# Patient Record
Sex: Male | Born: 2000 | Race: White | Hispanic: No | Marital: Single | Smoking: Never smoker
Health system: Southern US, Community
[De-identification: ages and names within clinical notes are randomized; demographics above are authoritative.]

## PROBLEM LIST (undated history)

## (undated) DIAGNOSIS — F8081 Childhood onset fluency disorder: Secondary | ICD-10-CM

## (undated) DIAGNOSIS — G459 Transient cerebral ischemic attack, unspecified: Secondary | ICD-10-CM

## (undated) HISTORY — PX: NO PAST SURGERIES: SHX2092

---
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
## 2020-08-18 ENCOUNTER — Telehealth (INDEPENDENT_AMBULATORY_CARE_PROVIDER_SITE_OTHER): Payer: Self-pay | Admitting: Pediatrics

## 2020-08-18 NOTE — Telephone Encounter (Signed)
°  Who's calling (name and relationship to patient) :mom / Ria Bush   Best contact number:[phone]  Provider they see:Dr. Artis Flock   Reason for call:mom called stating that [facility] needs a referral sent to them so they can see the patient. He has aged out and last was in office in 2020 by Dr. Artis Flock . Vikkie with [facility] her number is [phone]      PRESCRIPTION REFILL ONLY  Name of prescription:  Pharmacy:

## 2020-08-18 NOTE — Telephone Encounter (Signed)
A new referral needs to be put in for this patient, please advise

## 2020-08-18 NOTE — Telephone Encounter (Signed)
The patient last saw me in 2019 and never followed up.  I can not give a recommendation on what he needs currently. He can return for follow-up here where we decide what to do (we do not take new referrals after 18 but we don't have an "age out" limit) or best plan is the PCP can send a referral to an adult neurologist if he still has a concern.  Lorenz Coaster MD MPH

## 2020-08-19 NOTE — Telephone Encounter (Signed)
I called and spoke to patient's mother. DPR on file to speak with her. I advised patient's PCP is who needs to send a referral to [facility]. She advised patient does not currently have a PCP. I advised that he could follow up with Dr. Artis Flock. She agreed and scheduled an appointment for 08/21/20. Barrington Ellison

## 2020-08-21 ENCOUNTER — Ambulatory Visit (INDEPENDENT_AMBULATORY_CARE_PROVIDER_SITE_OTHER): Payer: Medicaid Other | Admitting: Pediatrics

## 2020-08-21 ENCOUNTER — Encounter (INDEPENDENT_AMBULATORY_CARE_PROVIDER_SITE_OTHER): Payer: Self-pay | Admitting: Pediatrics

## 2020-08-21 ENCOUNTER — Other Ambulatory Visit: Payer: Self-pay

## 2020-08-21 VITALS — BP 130/70 | HR 64 | Ht 71.0 in | Wt 210.0 lb

## 2020-08-21 DIAGNOSIS — R202 Paresthesia of skin: Secondary | ICD-10-CM | POA: Diagnosis not present

## 2020-08-21 MED ORDER — DULOXETINE HCL 20 MG PO CPEP: 20.0000 mg | ORAL_CAPSULE | Freq: Every day | ORAL | 3 refills | Status: DC

## 2020-08-21 NOTE — Patient Instructions (Signed)
Work-up thus far shows no answers for all over parasthesia, however most common cause is neuropathy (below) Recommend trying cymbalta to see if this helps Referral to adult neurology for further evaluation and management Recommend www.neurosymptoms.org for more information on potential non-neurologic causes of neurologic symptoms.    Peripheral Neuropathy Peripheral neuropathy is a type of nerve damage. It affects nerves that carry signals between the spinal cord and the arms, legs, and the rest of the body (peripheral nerves). It does not affect nerves in the spinal cord or brain. In peripheral neuropathy, one nerve or a group of nerves may be damaged. Peripheral neuropathy is a broad category that includes many specific nerve disorders, like diabetic neuropathy, hereditary neuropathy, and carpal tunnel syndrome. What are the causes? This condition may be caused by:  Diabetes. This is the most common cause of peripheral neuropathy.  Nerve injury.  Pressure or stress on a nerve that lasts a long time.  Lack (deficiency) of B vitamins. This can result from alcoholism, poor diet, or a restricted diet.  Infections.  Autoimmune diseases, such as rheumatoid arthritis and systemic lupus erythematosus.  Nerve diseases that are passed from parent to child (inherited).  Some medicines, such as cancer medicines (chemotherapy).  Poisonous (toxic) substances, such as lead and mercury.  Too little blood flowing to the legs.  Kidney disease.  Thyroid disease. In some cases, the cause of this condition is not known. What are the signs or symptoms? Symptoms of this condition depend on which of your nerves is damaged. Common symptoms include:  Loss of feeling (numbness) in the feet, hands, or both.  Tingling in the feet, hands, or both.  Burning pain.  Very sensitive skin.  Weakness.  Not being able to move a part of the body (paralysis).  Muscle twitching.  Clumsiness or poor  coordination.  Loss of balance.  Not being able to control your bladder.  Feeling dizzy.  Sexual problems. How is this diagnosed? Diagnosing and finding the cause of peripheral neuropathy can be difficult. Your health care provider will take your medical history and do a physical exam. A neurological exam will also be done. This involves checking things that are affected by your brain, spinal cord, and nerves (nervous system). For example, your health care provider will check your reflexes, how you move, and what you can feel. You may have other tests, such as:  Blood tests.  Electromyogram (EMG) and nerve conduction tests. These tests check nerve function and how well the nerves are controlling the muscles.  Imaging tests, such as CT scans or MRI to rule out other causes of your symptoms.  Removing a small piece of nerve to be examined in a lab (nerve biopsy). This is rare.  Removing and examining a small amount of the fluid that surrounds the brain and spinal cord (lumbar puncture). This is rare. How is this treated? Treatment for this condition may involve:  Treating the underlying cause of the neuropathy, such as diabetes, kidney disease, or vitamin deficiencies.  Stopping medicines that can cause neuropathy, such as chemotherapy.  Medicine to relieve pain. Medicines may include: ? Prescription or over-the-counter pain medicine. ? Antiseizure medicine. ? Antidepressants. ? Pain-relieving patches that are applied to painful areas of skin.  Surgery to relieve pressure on a nerve or to destroy a nerve that is causing pain.  Physical therapy to help improve movement and balance.  Devices to help you move around (assistive devices). Follow these instructions at home: Medicines  Take  over-the-counter and prescription medicines only as told by your health care provider. Do not take any other medicines without first asking your health care provider.  Do not drive or use heavy  machinery while taking prescription pain medicine. Lifestyle   Do not use any products that contain nicotine or tobacco, such as cigarettes and e-cigarettes. Smoking keeps blood from reaching damaged nerves. If you need help quitting, ask your health care provider.  Avoid or limit alcohol. Too much alcohol can cause a vitamin B deficiency, and vitamin B is needed for healthy nerves.  Eat a healthy diet. This includes: ? Eating foods that are high in fiber, such as fresh fruits and vegetables, whole grains, and beans. ? Limiting foods that are high in fat and processed sugars, such as fried or sweet foods. General instructions   If you have diabetes, work closely with your health care provider to keep your blood sugar under control.  If you have numbness in your feet: ? Check every day for signs of injury or infection. Watch for redness, warmth, and swelling. ? Wear padded socks and comfortable shoes. These help protect your feet.  Develop a good support system. Living with peripheral neuropathy can be stressful. Consider talking with a mental health specialist or joining a support group.  Use assistive devices and attend physical therapy as told by your health care provider. This may include using a walker or a cane.  Keep all follow-up visits as told by your health care provider. This is important. Contact a health care provider if:  You have new signs or symptoms of peripheral neuropathy.  You are struggling emotionally from dealing with peripheral neuropathy.  Your pain is not well-controlled. Get help right away if:  You have an injury or infection that is not healing normally.  You develop new weakness in an arm or leg.  You fall frequently. Summary  Peripheral neuropathy is when the nerves in the arms, or legs are damaged, resulting in numbness, weakness, or pain.  There are many causes of peripheral neuropathy, including diabetes, pinched nerves, vitamin  deficiencies, autoimmune disease, and hereditary conditions.  Diagnosing and finding the cause of peripheral neuropathy can be difficult. Your health care provider will take your medical history, do a physical exam, and do tests, including blood tests and nerve function tests.  Treatment involves treating the underlying cause of the neuropathy and taking medicines to help control pain. Physical therapy and assistive devices may also help. This information is not intended to replace advice given to you by your health care provider. Make sure you discuss any questions you have with your health care provider. Document Revised: 09/22/2017 Document Reviewed: 12/19/2016 Elsevier Patient Education  2020 [employer].

## 2020-08-21 NOTE — Progress Notes (Signed)
Patient: Jordan Turner MRN: [id number] Sex: male DOB: January 20, 2001  Provider: Lorenz Coaster, MD Location of Care: [facility]  Note type: Routine follow-up  History of Present Illness:  Jordan Turner is a 19 y.o. male with no significant past medical history who I am seeing for routine follow-up. Patient was last seen on 08/15/18 where patient was evaluated for parenthesia. Patient was referred to [facility] for NCS/EMG length dependent paraesthesia.  Since the last appointment, patient did not follow up.    Patient presents today with mother.     Paresthesia: Little to no improvement. Feet is still numb however there is no longer a static feeling. Numbness continues to be from the knee of each leg to feet. No longer having lost of taste, shortness of breath or tightness in his chest.  Pain: Constant pain throughout the body mostly in the extremities. Mostly after first waking up. Will have some issue holding some things; drops pencils and forks at times. Mother reports that he does not feel when being hit in the arm.   GI: no issues sensing his need to use the bathroom.  Sleep: Falls asleep at about 1am and wakes up at 10:30 am. Usually sleeps through the night. No naps thought the day. Denies being woken up by the pain  School: In college Lives at home.   Mood: Patient reports that they are happy.   Diagnostics: 10/25/18 Nerve Conduction Study with EMG: IMPRESSION: This is a normal study.  No electrodiagnostic evidence of large fiber neuropathy or myopathy at this time    Past Medical History Past Medical History:  Diagnosis Date   Stutter    TIA (transient ischemic attack)     Surgical History Past Surgical History:  Procedure Laterality Date   NO PAST SURGERIES      Family History family history includes Anxiety disorder in his sister; Bipolar disorder in his maternal grandfather; Depression in his sister; Migraines in his  mother.   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 He lives with mom, dad, and siblings. He enjoys playing video games, read books, and talk to people.     Allergies No Known Allergies  Medications No current outpatient medications on file prior to visit.   No current facility-administered medications on file prior to visit.   The medication list was reviewed and reconciled. All changes or newly prescribed medications were explained.  A complete medication list was provided to the patient/caregiver.  Physical Exam BP 130/70    Pulse 64    Ht 5\' 11"  (1.803 m)    Wt 210 lb (95.3 kg)    BMI 29.29 kg/m  95 %ile (Z= 1.66) based on CDC (Boys, 2-20 Years) weight-for-age data using vitals from 08/21/2020.  No exam data present Gen: well appearing young man Skin: No rash, No neurocutaneous stigmata. HEENT: Normocephalic, no dysmorphic features, no conjunctival injection, nares patent, mucous membranes moist, oropharynx clear. Neck: Supple, no meningismus. No focal tenderness. Resp: Clear to auscultation bilaterally CV: Regular rate, normal S1/S2, no murmurs, no rubs Abd: BS present, abdomen soft, non-tender, non-distended. No hepatosplenomegaly or mass Ext: Warm and well-perfused. No deformities, no muscle wasting, ROM full.  Neurological Examination: MS: Awake, alert, interactive. Normal eye contact, answered the questions appropriately for age, speech was fluent,  Normal comprehension.  Attention and concentration were normal. Cranial Nerves: Pupils were equal and reactive to light;  normal  fundoscopic exam with sharp discs, visual field full with confrontation test; EOM normal, no nystagmus; no ptsosis, no double vision, intact facial sensation, face symmetric with full strength of facial muscles, hearing intact to finger rub bilaterally, palate elevation is symmetric, tongue protrusion is symmetric with  full movement to both sides.  Sternocleidomastoid and trapezius are with normal strength. Motor-Normal tone throughout, No abnormal movements          Delt    Bic  Tri  Wr Ex  Grip   Hip flex  Hip ex Knee flex Knee ex  Dorsi  Plantar L 5      5     5     5         5       5          5         5               5     5    5   R 5      5     5     5         5       5          5         5               5     5    5   Reflexes- Reflexes 2+ and symmetric in the biceps, triceps, patellar and achilles tendon. Plantar responses flexor bilaterally, no clonus noted Sensation:Light touch, pinprick, position sense, and vibration sense are decreased thorughout but intact.  Coordination and balance: No dysmetria on FTN test. No difficulty with balance when standing on one foot bilaterally. Romberg negative.   Gait: Gait is steady with normal steps, base, arm swing, and turning. Tandem gait was normal. Was able to perform toe walking and heel walking without difficulty.    Diagnosis: 1. Paresthesia     Assessment and Plan [facility] is a 19 y.o. male with history of  all over parasthesia who I am seeing in follow-up. Patient continues to have symptoms of paresthesia. On exam strength, reflexes  and coordination are normal.  He reports decreased stimulus to fine touch and pin prick thorughout, however it does not correspond to dermatomes. MRI, labwork, EMG negative for any central or peripheral cause.  I discussed symptomatic medication management for pain and see if this also helps with the numbness patient is experiencing. I gave options such as gabapentin and Cymbalta. Mother and patient preferred to try Cymbalta as it will be only once a day. I will refer patient to adult Turner given age and greater experience with sensorineural symptoms for any further recommendations.   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     Return in about 3 months (around 11/21/2020).  12 MD MPH [facility]  [address] [place], [place], [address] [place] Phone: [phone]   By signing below, I, [place] attest that this documentation has been prepared under the direction of [id number], MD.    I, [phone], MD personally performed the services described in this documentation. All medical record entries made by the scribe were at my direction. I have reviewed the  chart and agree that the record reflects my personal performance and is accurate and complete Electronically signed by Denyce Robert and Jordan Coaster, MD 09/14/20 8:36 AM

## 2020-11-11 ENCOUNTER — Ambulatory Visit: Payer: Medicaid Other | Admitting: Diagnostic Neuroimaging

## 2020-11-27 ENCOUNTER — Ambulatory Visit (INDEPENDENT_AMBULATORY_CARE_PROVIDER_SITE_OTHER): Payer: Medicaid Other | Admitting: Pediatrics

## 2020-11-27 NOTE — Progress Notes (Incomplete)
   Patient: Jordan Turner MRN: [id number] Sex: male DOB: October 18, 2001  Provider: Lorenz Coaster, MD Location of Care: [facility]  Note type: Routine follow-up  History of Present Illness:  [facility] is a 20 y.o. male with history of parenthesia who I am seeing for routine follow-up. Patient was last seen on 08/21/20 where Cymbalta 20mg  was started for chronic neuropathic pain and referral was made to adult neurology for further evaluation and management.  Since the last appointment,  patient has had no ED visits or hospital admissions.  Patient presents today with ***.      Screenings:  Patient History:   Diagnostics:    Past Medical History Past Medical History:  Diagnosis Date  . Stutter   . TIA (transient ischemic attack)     Surgical History Past Surgical History:  Procedure Laterality Date  . NO PAST SURGERIES      Family History family history includes Anxiety disorder in his sister; Bipolar disorder in his maternal grandfather; Depression in his sister; Migraines in his mother.   Social History Social History   Social History Narrative   Jordan Turner is a [employer]. He attended [employer]. He lives with mom, dad, and siblings. He enjoys playing video games, read books, and talk to people.     Allergies No Known Allergies  Medications Current Outpatient Medications on File Prior to Visit  Medication Sig Dispense Refill  . DULoxetine (CYMBALTA) 20 MG capsule Take 1 capsule (20 mg total) by mouth daily. 31 capsule 3   No current facility-administered medications on file prior to visit.   The medication list was reviewed and reconciled. All changes or newly prescribed medications were explained.  A complete medication list was provided to the patient/caregiver.  Physical Exam There were no vitals taken for this visit. No weight on file for this encounter.  No exam data  present  ***   Diagnosis:@DIAGLIST @   Assessment and Plan Ibrahem Volkman is a 20 y.o. male with history of ***who I am seeing in follow-up.     No follow-ups on file.  12 MD MPH Neurology and Neurodevelopment [facility]  [address] [place], [place], [place] Phone: [phone]       By signing below, I, [phone] attest that this documentation has been prepared under the direction of Denyce Robert, MD.    I, Lorenz Coaster, MD personally performed the services described in this documentation. All medical record entries made by the scribe were at my direction. I have reviewed the chart and agree that the record reflects my personal performance and is accurate and complete Electronically signed by Lorenz Coaster and Denyce Robert, MD *** ***

## 2021-01-12 ENCOUNTER — Encounter: Payer: Self-pay | Admitting: Diagnostic Neuroimaging

## 2021-01-12 ENCOUNTER — Ambulatory Visit (INDEPENDENT_AMBULATORY_CARE_PROVIDER_SITE_OTHER): Payer: Medicaid Other | Admitting: Diagnostic Neuroimaging

## 2021-01-12 VITALS — BP 151/72 | HR 68 | Ht 70.0 in | Wt 211.4 lb

## 2021-01-12 DIAGNOSIS — R202 Paresthesia of skin: Secondary | ICD-10-CM | POA: Diagnosis not present

## 2021-01-12 NOTE — Patient Instructions (Signed)
  SENSORY DISTURBANCE - unclear neurologic etiology; does not fit with TIA; I agree with Dr. Artis Flock, who also suspected conversion reaction (MRI brain, MRI cervical spine, EMG/NCS, labs are normal) - reassured patient; encouraged to improve sleep hygiene, nutrition, exercise

## 2021-01-12 NOTE — Progress Notes (Signed)
[facility]  PATIENT: [facility] DOB: [birth year]  REFERRING CLINICIAN: Nyoka Cowden, MD HISTORY FROM: Patient REASON FOR VISIT: New consult   HISTORICAL  CHIEF COMPLAINT:  Chief Complaint  Patient presents with  . Paresthesias    Rm 6 New Pt, had EMG 10/25/2018 "unable to feel textures , constant pain in arms/legs, can't feel"     HISTORY OF PRESENT ILLNESS:   20 year old male here for evaluation of abnormal sensation ability.  Patient has history of premature birth and developmental delay.  May 2019 patient had onset of inability to feel cold or hot sensations, textures.  He was also evaluated for chest pain and shortness of breath.  No specific triggering factors.  Neurologic examination was variable and inconsistent.  EMG nerve conduction study, MRI of the brain and cervical spine are unremarkable.  Possibility of conversion reaction was raised.  Patient has had some improvement in symptoms since that time.  Now he can feel pain in temperature.  He is still having difficulty identifying certain textures.  Patient currently at [employer].  He also works at [employer] 3 days a week, night shift.  He has interrupted and limited sleep.  He is not exercising that much.   REVIEW OF SYSTEMS: Full 14 system review of systems performed and negative with exception of: As per HPI.  ALLERGIES: No Known Allergies  HOME MEDICATIONS: Outpatient Medications Prior to Visit  Medication Sig Dispense Refill  . DULoxetine (CYMBALTA) 20 MG capsule Take 1 capsule (20 mg total) by mouth daily. (Patient not taking: Reported on 01/12/2021) 31 capsule 3   No facility-administered medications prior to visit.    PAST MEDICAL HISTORY: Past Medical History:  Diagnosis Date  . Premature birth    at 24 weeks  . Stutter   . TIA (transient ischemic attack) 2019    PAST SURGICAL HISTORY: Past Surgical History:  Procedure Laterality  Date  . NO PAST SURGERIES      FAMILY HISTORY: Family History  Problem Relation Age of Onset  . Migraines Mother   . Hypertension Mother   . Cervical cancer Mother   . Diabetes Mother   . Depression Sister   . Anxiety disorder Sister   . Bipolar disorder Sister   . Diabetes Sister   . Other Brother        Von Willebrand disease  . Hypertension Maternal Grandmother   . Diabetes Maternal Grandmother   . Bipolar disorder Maternal Grandfather   . Seizures Neg Hx   . Schizophrenia Neg Hx   . ADD / ADHD Neg Hx   . Autism Neg Hx     SOCIAL HISTORY: Social History   Socioeconomic History  . Marital status: Single    Spouse name: Not on file  . Number of children: 0  . Years of education: 65  . Highest education level: Not on file  Occupational History    Comment: student, [employer]  Tobacco Use  . Smoking status: Never Smoker  . Smokeless tobacco: Never Used  Substance and Sexual Activity  . Alcohol use: Never  . Drug use: Never  . Sexual activity: Not on file  Other Topics Concern  . 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 He lives with mom, dad, and siblings. He enjoys playing video games, read books, and talk to people.    Social Determinants of Corporate investment banker  Strain: Not on file  Food Insecurity: Not on file  Transportation Needs: Not on file  Physical Activity: Not on file  Stress: Not on file  Social Connections: Not on file  Intimate Partner Violence: Not on file     PHYSICAL EXAM  GENERAL EXAM/CONSTITUTIONAL: Vitals:  Vitals:   01/12/21 1424  BP: (!) 151/72  Pulse: 68  Weight: 211 lb 6.4 oz (95.9 kg)  Height: 5\' 10"  (1.778 m)   Body mass index is 30.33 kg/m. Wt Readings from Last 3 Encounters:  01/12/21 211 lb 6.4 oz (95.9 kg) (95 %, Z= 1.66)*  08/21/20 210 lb (95.3 kg) (95 %, Z= 1.66)*  08/15/18 176 lb 3.2 oz (79.9 kg) (85 %, Z= 1.05)*   * Growth percentiles are  based on CDC (Boys, 2-20 Years) data.    Patient is in no distress; well developed, nourished and groomed; neck is supple  CARDIOVASCULAR:  Examination of carotid arteries is normal; no carotid bruits  Regular rate and rhythm, no murmurs  Examination of peripheral vascular system by observation and palpation is normal  EYES:  Ophthalmoscopic exam of optic discs and posterior segments is normal; no papilledema or hemorrhages No exam data present  MUSCULOSKELETAL:  Gait, strength, tone, movements noted in Neurologic exam below  NEUROLOGIC: MENTAL STATUS:  No flowsheet data found.  awake, alert, oriented to person, place and time  recent and remote memory intact  normal attention and concentration  language fluent, comprehension intact, naming intact  fund of knowledge appropriate  CRANIAL NERVE:   2nd - no papilledema on fundoscopic exam  2nd, 3rd, 4th, 6th - pupils equal and reactive to light, visual fields full to confrontation, extraocular muscles intact, no nystagmus  5th - facial sensation symmetric  7th - facial strength symmetric  8th - hearing intact  9th - palate elevates symmetrically, uvula midline  11th - shoulder shrug symmetric  12th - tongue protrusion midline  MOTOR:   normal bulk and tone, full strength in the BUE, BLE  SENSORY:   normal and symmetric to light touch, temperature, vibration; DIFFICULTY IDENTIFYING TEXTURES OF HIS PANTS, SHOES, SWEATSHIRT AND OTHER ITEMS  COORDINATION:   finger-nose-finger, fine finger movements normal  REFLEXES:   deep tendon reflexes present and symmetric  GAIT/STATION:   narrow based gait     DIAGNOSTIC DATA (LABS, IMAGING, TESTING) - I reviewed patient records, labs, notes, testing and imaging myself where available.  Lab Results  Component Value Date   WBC 5.9 06/27/2018   HGB 16.6 (H) 06/27/2018   HCT 48.8 06/27/2018   MCV 88.2 06/27/2018   PLT 244 06/27/2018      Component  Value Date/Time   NA 140 06/27/2018 1501   K 4.1 06/27/2018 1501   CL 103 06/27/2018 1501   CO2 27 06/27/2018 1501   GLUCOSE 98 06/27/2018 1501   BUN 14 06/27/2018 1501   CREATININE 0.86 06/27/2018 1501   CALCIUM 9.9 06/27/2018 1501   PROT 7.8 03/07/2018 1247   ALBUMIN 5.1 (H) 03/07/2018 1247   AST 48 (H) 03/07/2018 1247   ALT 70 (H) 03/07/2018 1247   ALKPHOS 93 03/07/2018 1247   BILITOT 0.5 03/07/2018 1247   GFRNONAA NOT CALCULATED 06/27/2018 1501   GFRAA NOT CALCULATED 06/27/2018 1501   No results found for: CHOL, HDL, LDLCALC, LDLDIRECT, TRIG, CHOLHDL No results found for: 08/27/2018 Lab Results  Component Value Date   VITAMINB12 521 07/04/2018   No results found for: TSH   06/27/2018 MRI brain -  Stable normal MRI of the brain.  03/07/2018 MRI brain and cervical spine Unremarkable  10/25/2018 EMG nerve conduction study - This is a normal study.  No electrodiagnostic evidence of large fiber neuropathy or myopathy at this time    ASSESSMENT AND PLAN  20 y.o. year old male here with history of premature birth development delay, with abnormal sensory perception, with difficulty identifying particular textures and sensations.  Symptoms have improved since onset in May 2019.  No clear neurologic etiology identified.  Dx:  1. Paresthesia      PLAN:  SENSORY DISTURBANCE - unclear neurologic etiology; does not fit with TIA; I agree with Dr. Artis Flock, who also suspected conversion reaction (MRI brain, MRI cervical spine, EMG/NCS, labs are normal) - reassured patient; encouraged to improve sleep hygiene, nutrition, exercise   Return for return to PCP.    Suanne Marker, MD 01/12/2021, 4:36 PM Certified in Neurology, Neurophysiology and Neuroimaging  [facility] [address], [address] [place], [place] [address] [phone]

## 2023-01-09 ENCOUNTER — Emergency Department
Admission: EM | Admit: 2023-01-09 | Discharge: 2023-01-09 | Disposition: A | Discharge status: Home / Self Care | Payer: Worker's Compensation | Attending: Emergency Medicine | Admitting: Emergency Medicine

## 2023-01-09 ENCOUNTER — Other Ambulatory Visit: Payer: Self-pay

## 2023-01-09 DIAGNOSIS — W260XXA Contact with knife, initial encounter: Secondary | ICD-10-CM | POA: Insufficient documentation

## 2023-01-09 DIAGNOSIS — Z23 Encounter for immunization: Secondary | ICD-10-CM | POA: Diagnosis not present

## 2023-01-09 DIAGNOSIS — Y990 Civilian activity done for income or pay: Secondary | ICD-10-CM | POA: Insufficient documentation

## 2023-01-09 DIAGNOSIS — S61211A Laceration without foreign body of left index finger without damage to nail, initial encounter: Secondary | ICD-10-CM | POA: Insufficient documentation

## 2023-01-09 MED ORDER — TETANUS-DIPHTH-ACELL PERTUSSIS 5-2.5-18.5 LF-MCG/0.5 IM SUSY
0.5000 mL | PREFILLED_SYRINGE | Freq: Once | INTRAMUSCULAR | Status: AC
  Administered 2023-01-09: 0.5 mL via INTRAMUSCULAR
  Filled 2023-01-09: qty 0.5

## 2023-01-09 MED ORDER — LIDOCAINE HCL (PF) 1 % IJ SOLN
10.0000 mL | Freq: Once | INTRAMUSCULAR | Status: AC
  Administered 2023-01-09: 10 mL
  Filled 2023-01-09: qty 10

## 2023-01-09 NOTE — ED Triage Notes (Signed)
Pt to ED via POV from work. Pt works at [employer] and cut his left pointer finger with a knife. Pt reports last tetanus 2019. Pt denies blood thinners. Bleeding controlled at this time.

## 2023-01-09 NOTE — ED Provider Notes (Signed)
[facility] Provider Note  Patient Contact: 3:30 PM (approximate)   History   Laceration   HPI  [facility] is a 22 y.o. male who presents emergency department complaining of a laceration to the index finger of the left hand.  Patient was using a knife at work to strip some wire when it slipped to make contact with his finger.  He has a small laceration over the MCP joint.  Patient had difficulty controlling bleeding initially but bleeding has been controlled.  He went to urgent care first and was referred to the emergency department.  Full range of motion to the digit.  No other injury or complaint.  Last tetanus shot was a little over 5 years ago.     Physical Exam   Triage Vital Signs: ED Triage Vitals [01/09/23 1348]  Enc Vitals Group     BP (!) 168/91     Pulse Rate 100     Resp 18     Temp 98 F (36.7 C)     Temp Source Oral     SpO2 95 %     Weight      Height      Head Circumference      Peak Flow      Pain Score 5     Pain Loc      Pain Edu?      Excl. in [place]?     Most recent vital signs: Vitals:   01/09/23 1348  BP: (!) 168/91  Pulse: 100  Resp: 18  Temp: 98 F (36.7 C)  SpO2: 95%     General: Alert and in no acute distress.  Cardiovascular:  Good peripheral perfusion Respiratory: Normal respiratory effort without tachypnea or retractions. Lungs CTAB.  Musculoskeletal: Full range of motion to all extremities.  Visualization of the left hand reveals a roughly 1 cm laceration of the MCP joint.  Edges are smooth in nature.  Full range of motion, cap refill and sensation intact to the index finger.  No visible foreign body.  No active bleeding at this time. Neurologic:  No gross focal neurologic deficits are appreciated.  Skin:   No rash noted Other:   ED Results / Procedures / Treatments   Labs (all labs ordered are listed, but only abnormal results are displayed) Labs Reviewed - No data to  display   EKG     RADIOLOGY    No results found.  PROCEDURES:  Critical Care performed: No  ..Laceration Repair  Date/Time: 01/09/2023 5:21 PM  Performed by: Darletta Moll, PA-C Authorized by: Darletta Moll, PA-C   Consent:    Consent obtained:  Verbal   Consent given by:  Patient   Risks discussed:  Infection, pain and poor wound healing Universal protocol:    Procedure explained and questions answered to patient or proxy's satisfaction: yes     Patient identity confirmed:  Verbally with patient Anesthesia:    Anesthesia method:  Local infiltration   Local anesthetic:  Lidocaine 1% w/o epi Laceration details:    Location:  Finger   Finger location:  L index finger   Length (cm):  1 Exploration:    Hemostasis achieved with:  Direct pressure   Imaging outcome: foreign body not noted     Wound exploration: wound explored through full range of motion and entire depth of wound visualized     Wound extent: no foreign body, no nerve damage, no tendon damage, no underlying fracture  and no vascular damage     Contaminated: no   Treatment:    Area cleansed with:  Povidone-iodine and saline   Amount of cleaning:  Extensive   Irrigation solution:  Sterile saline   Irrigation volume:  1 L   Irrigation method:  Syringe   Visualized foreign bodies/material removed: no   Skin repair:    Repair method:  Sutures   Suture size:  4-0   Suture material:  Nylon   Suture technique:  Simple interrupted   Number of sutures:  3 Approximation:    Approximation:  Close Repair type:    Repair type:  Simple Post-procedure details:    Dressing:  Open (no dressing)   Procedure completion:  Tolerated well, no immediate complications    MEDICATIONS ORDERED IN ED: Medications  Tdap (BOOSTRIX) injection 0.5 mL (0.5 mLs Intramuscular Given 01/09/23 1608)  lidocaine (PF) (XYLOCAINE) 1 % injection 10 mL (10 mLs Infiltration Given by Other 01/09/23 1651)     IMPRESSION  / MDM / ASSESSMENT AND PLAN / ED COURSE  I reviewed the triage vital signs and the nursing notes.                                 Differential diagnosis includes, but is not limited to, finger injury, retained foreign body, tendon injury  Patient's presentation is most consistent with acute presentation with potential threat to life or bodily function.   Patient's diagnosis is consistent with laceration of the finger.  Patient presents emergency department after sustaining a laceration at work to the left index finger.  Sutures were placed as described above.  Patient tolerated well.  Patient need a tetanus shot updated which was done today.  Wound care instructions discussed with patient.  Follow-up primary care or urgent care in a week for suture removal..  Patient is given ED precautions to return to the ED for any worsening or new symptoms.     FINAL CLINICAL IMPRESSION(S) / ED DIAGNOSES   Final diagnoses:  Laceration of left index finger without foreign body without damage to nail, initial encounter     Rx / DC Orders   ED Discharge Orders     None        Note:  This document was prepared using Dragon voice recognition software and may include unintentional dictation errors.   Brynda Peon 01/09/23 1740    Harvest Dark, MD 01/09/23 2306
